# Patient Record
Sex: Male | Born: 1990 | Hispanic: Yes | Marital: Married | Smoking: Never smoker
Health system: Southern US, Community
[De-identification: ages and names within clinical notes are randomized; demographics above are authoritative.]

## PROBLEM LIST (undated history)

## (undated) DIAGNOSIS — G4733 Obstructive sleep apnea (adult) (pediatric): Secondary | ICD-10-CM

---

## 2021-02-08 ENCOUNTER — Emergency Department: Payer: 59

## 2021-02-08 ENCOUNTER — Emergency Department
Admission: EM | Admit: 2021-02-08 | Discharge: 2021-02-08 | Disposition: A | Discharge status: Home / Self Care | Payer: 59 | Attending: Emergency Medicine | Admitting: Emergency Medicine

## 2021-02-08 ENCOUNTER — Other Ambulatory Visit: Payer: Self-pay

## 2021-02-08 ENCOUNTER — Encounter: Payer: Self-pay | Admitting: Emergency Medicine

## 2021-02-08 DIAGNOSIS — G43109 Migraine with aura, not intractable, without status migrainosus: Secondary | ICD-10-CM

## 2021-02-08 DIAGNOSIS — R410 Disorientation, unspecified: Secondary | ICD-10-CM | POA: Diagnosis not present

## 2021-02-08 DIAGNOSIS — G43809 Other migraine, not intractable, without status migrainosus: Secondary | ICD-10-CM | POA: Insufficient documentation

## 2021-02-08 DIAGNOSIS — R0602 Shortness of breath: Secondary | ICD-10-CM | POA: Insufficient documentation

## 2021-02-08 DIAGNOSIS — R519 Headache, unspecified: Secondary | ICD-10-CM | POA: Diagnosis present

## 2021-02-08 DIAGNOSIS — R209 Unspecified disturbances of skin sensation: Secondary | ICD-10-CM | POA: Insufficient documentation

## 2021-02-08 DIAGNOSIS — R4182 Altered mental status, unspecified: Secondary | ICD-10-CM | POA: Diagnosis not present

## 2021-02-08 HISTORY — DX: Obstructive sleep apnea (adult) (pediatric): G47.33

## 2021-02-08 LAB — CBC
HCT: 45.4 % (ref 39.0–52.0)
Hemoglobin: 15.4 g/dL (ref 13.0–17.0)
MCH: 28.4 pg (ref 26.0–34.0)
MCHC: 33.9 g/dL (ref 30.0–36.0)
MCV: 83.8 fL (ref 80.0–100.0)
Platelets: 309 10*3/uL (ref 150–400)
RBC: 5.42 MIL/uL (ref 4.22–5.81)
RDW: 12.8 % (ref 11.5–15.5)
WBC: 8.8 10*3/uL (ref 4.0–10.5)
nRBC: 0 % (ref 0.0–0.2)

## 2021-02-08 LAB — COMPREHENSIVE METABOLIC PANEL
ALT: 26 U/L (ref 0–44)
AST: 45 U/L — ABNORMAL HIGH (ref 15–41)
Albumin: 5.4 g/dL — ABNORMAL HIGH (ref 3.5–5.0)
Alkaline Phosphatase: 29 U/L — ABNORMAL LOW (ref 38–126)
Anion gap: 15 (ref 5–15)
BUN: 17 mg/dL (ref 6–20)
CO2: 20 mmol/L — ABNORMAL LOW (ref 22–32)
Calcium: 10.1 mg/dL (ref 8.9–10.3)
Chloride: 104 mmol/L (ref 98–111)
Creatinine, Ser: 1.02 mg/dL (ref 0.61–1.24)
GFR, Estimated: >60 mL/min (ref >60)
Glucose, Bld: 161 mg/dL — ABNORMAL HIGH (ref 70–99)
Potassium: 3.5 mmol/L (ref 3.5–5.1)
Sodium: 139 mmol/L (ref 135–145)
Total Bilirubin: 0.9 mg/dL (ref 0.3–1.2)
Total Protein: 8.3 g/dL — ABNORMAL HIGH (ref 6.5–8.1)

## 2021-02-08 LAB — DIFFERENTIAL
Abs Immature Granulocytes: 0.04 10*3/uL (ref 0.00–0.07)
Basophils Absolute: 0 10*3/uL (ref 0.0–0.1)
Basophils Relative: 1 %
Eosinophils Absolute: 0 10*3/uL (ref 0.0–0.5)
Eosinophils Relative: 0 %
Immature Granulocytes: 1 %
Lymphocytes Relative: 13 %
Lymphs Abs: 1.2 10*3/uL (ref 0.7–4.0)
Monocytes Absolute: 0.2 10*3/uL (ref 0.1–1.0)
Monocytes Relative: 3 %
Neutro Abs: 7.3 10*3/uL (ref 1.7–7.7)
Neutrophils Relative %: 82 %

## 2021-02-08 LAB — PROTIME-INR
INR: 1.2 (ref 0.8–1.2)
Prothrombin Time: 14.9 seconds (ref 11.4–15.2)

## 2021-02-08 LAB — ETHANOL: Alcohol, Ethyl (B): <10 mg/dL (ref <10)

## 2021-02-08 LAB — APTT: aPTT: 23 seconds — ABNORMAL LOW (ref 24–36)

## 2021-02-08 IMAGING — CT CT ANGIO HEAD-NECK (W OR W/O PERF)
2 of 11 series · 7 of 33 positions shown · IV contrast (APPLIED)
Comparison: None.

CLINICAL DATA: Neuro deficit, acute, stroke suspected.

EXAM:
CT ANGIOGRAPHY HEAD AND NECK
TECHNIQUE: Multidetector CT imaging of the head and neck was performed using
the standard protocol during bolus administration of intravenous
contrast. Multiplanar CT image reconstructions and MIPs were
obtained to evaluate the vascular anatomy. Carotid stenosis
measurements (when applicable) are obtained utilizing NASCET
criteria, using the distal internal carotid diameter as the
denominator.
CONTRAST:  75mL OMNIPAQUE IOHEXOL 350 MG/ML SOLN

[Series 8: cta head neck (person_name) · axial · 0.60mm/px · z∈[-210,-88]mm · 2 of 184 slices shown]
[im 62/184  soft-tissue]
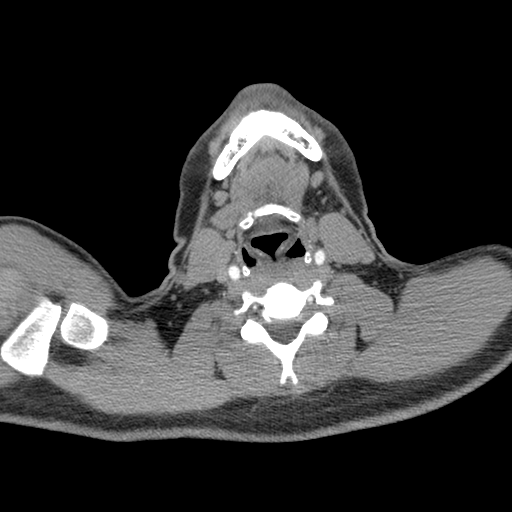
[im 123/184  soft-tissue]
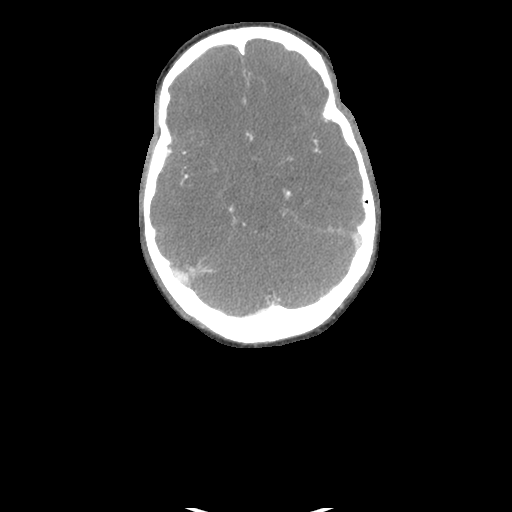

[Series 10: ax thin · axial · 0.50mm/px · z∈[-305,-72]mm · 5 of 359 slices shown]
[im 60/359  soft-tissue]
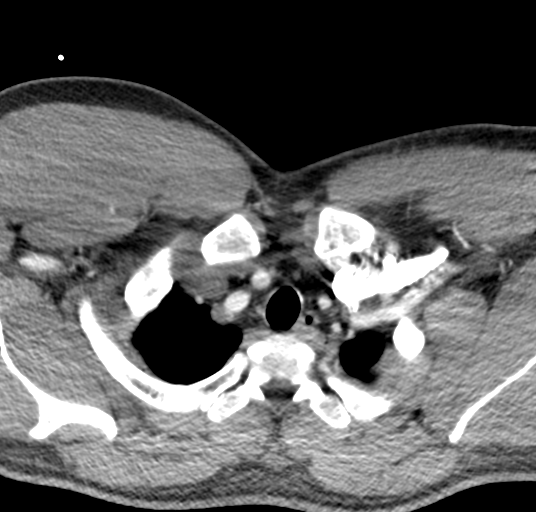
[im 120/359  bone]
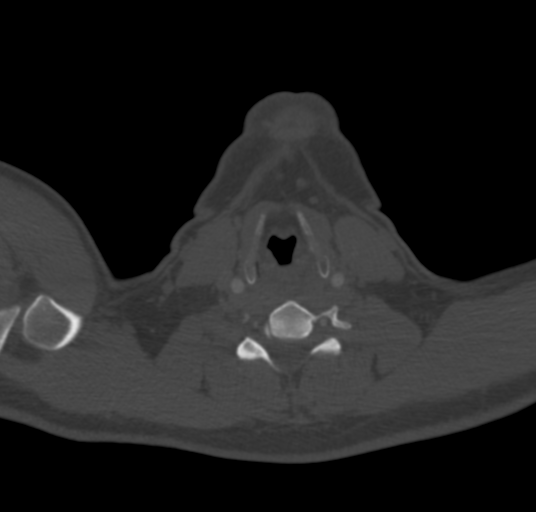
[im 180/359  soft-tissue]
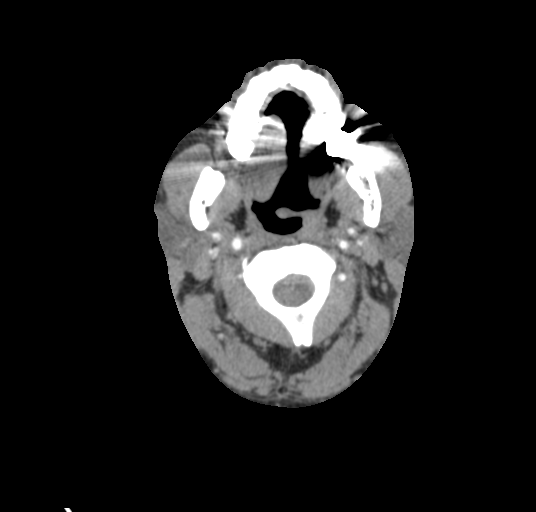
[im 239/359  bone]
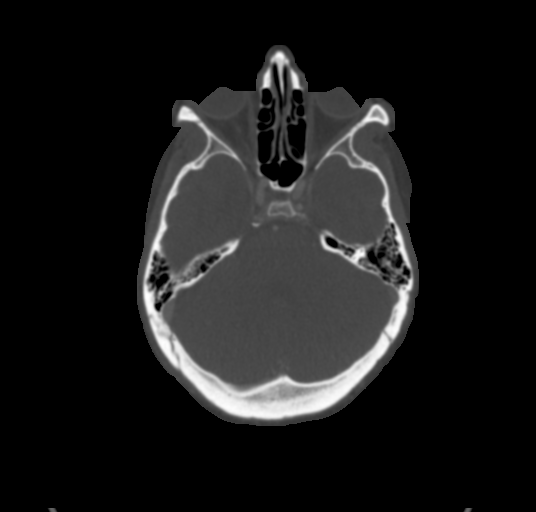
[im 299/359  soft-tissue]
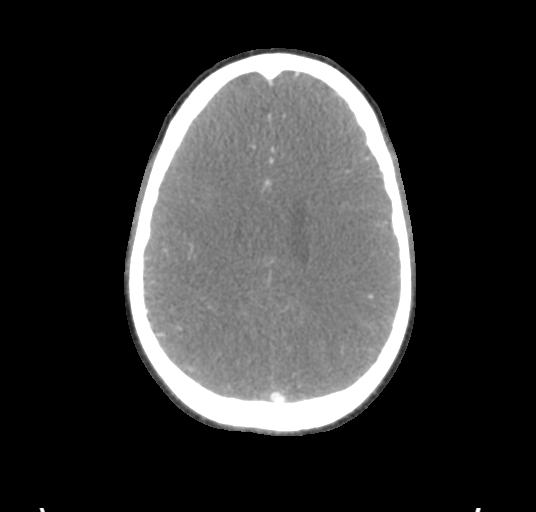

[7 of 33 positions shown; findings below may reference images not displayed]

FINDINGS: CT HEAD FINDINGS

Brain: No evidence of acute infarction, hemorrhage, hydrocephalus,
extra-axial collection or mass lesion/mass effect. Cystic dilatation
of the cavum of the cavum septum pellucidum cavum vergae.

Vascular: No hyperdense vessel or unexpected calcification.

Skull: Normal. Negative for fracture or focal lesion.

Sinuses: Imaged portions are clear.

Orbits: No acute finding.

Review of the MIP images confirms the above findings

CTA NECK FINDINGS

Aortic arch: Common origin of innominate and left common carotid
artery from the aortic. Imaged portion shows no evidence of aneurysm
or dissection. No significant stenosis of the major arch vessel
origins.

Right carotid system: No evidence of dissection, stenosis (50% or
greater) or occlusion.

Left carotid system: No evidence of dissection, stenosis (50% or
greater) or occlusion.

Vertebral arteries: Dominant left vertebral artery with normal
course and caliber. Hypoplastic right vertebral artery ending in
PICA.

Skeleton: Negative.

Other neck: Negative.

Upper chest: Negative.

Review of the MIP images confirms the above findings

CTA HEAD FINDINGS

Anterior circulation: No significant stenosis, proximal occlusion,
aneurysm, or vascular malformation.

Posterior circulation: No significant stenosis, proximal occlusion,
aneurysm, or vascular malformation.

Venous sinuses: As permitted by contrast timing, patent.

Anatomic variants: Hypoplastic right vertebral artery ends in PICA.

Review of the MIP images confirms the above findings
IMPRESSION: 1. No acute intracranial abnormality.
2. No intracranial large vessel occlusion or hemodynamically
significant stenosis.
3. No aneurysm, AVM or dural AVF.
4. No hemodynamically significant stenosis of the major neck
arteries.
5. Incidentally noted cystic dilatation of the cavum of the cavum
septum pellucidum/cavum vergae. No hydrocephalus.

## 2021-02-08 MED ORDER — DIPHENHYDRAMINE HCL 50 MG/ML IJ SOLN
25.0000 mg | Freq: Once | INTRAMUSCULAR | Status: AC
  Administered 2021-02-08: 25 mg via INTRAVENOUS
  Filled 2021-02-08: qty 1

## 2021-02-08 MED ORDER — IOHEXOL 350 MG/ML SOLN
75.0000 mL | Freq: Once | INTRAVENOUS | Status: AC | PRN
  Administered 2021-02-08: 75 mL via INTRAVENOUS

## 2021-02-08 MED ORDER — KETOROLAC TROMETHAMINE 30 MG/ML IJ SOLN
15.0000 mg | Freq: Once | INTRAMUSCULAR | Status: AC
  Administered 2021-02-08: 15 mg via INTRAVENOUS
  Filled 2021-02-08: qty 1

## 2021-02-08 MED ORDER — DEXAMETHASONE SODIUM PHOSPHATE 10 MG/ML IJ SOLN
10.0000 mg | Freq: Once | INTRAMUSCULAR | Status: AC
  Administered 2021-02-08: 10 mg via INTRAVENOUS
  Filled 2021-02-08: qty 1

## 2021-02-08 MED ORDER — LACTATED RINGERS IV BOLUS
1000.0000 mL | Freq: Once | INTRAVENOUS | Status: AC
  Administered 2021-02-08: 1000 mL via INTRAVENOUS

## 2021-02-08 MED ORDER — BUTALBITAL-APAP-CAFFEINE 50-325-40 MG PO TABS: 1.0000 | ORAL_TABLET | Freq: Four times a day (QID) | ORAL | 0 refills | Status: AC | PRN

## 2021-02-08 MED ORDER — PROCHLORPERAZINE EDISYLATE 10 MG/2ML IJ SOLN
10.0000 mg | Freq: Once | INTRAMUSCULAR | Status: AC
  Administered 2021-02-08: 10 mg via INTRAVENOUS
  Filled 2021-02-08: qty 2

## 2021-02-08 NOTE — Discharge Instructions (Signed)
As we discussed, I'd recommend seeing a Neurologist for an MRI, further work-up. Ideally, in the next 1-2 weeks.  Call your OSA doctor, who may be able to order this for you.  If not, Dr. Malvin Johns is a local Neurologist, who I believe is [facility]-affiliated.   If anything changes, come back and we'll be happy to see you and order this.

## 2021-02-08 NOTE — ED Triage Notes (Signed)
First Nurse Note:  C/O headache since 0800.  C/o feeling weak, vomiting and hand numbness.  Patient is AAOx3.  Skin warm and dry. Appears uncomfortable.  Speech clear.  MAE equally and strong.

## 2021-02-08 NOTE — ED Provider Notes (Signed)
[facility] Emergency Department Provider Note  ____________________________________________   Event Date/Time   First MD Initiated Contact with Patient 02/08/21 1613     (approximate)  I have reviewed the triage vital signs and the nursing notes.   HISTORY  Chief Complaint Headache, Shortness of Breath, and Nausea    HPI Patrick Kirby is a 30 y.o. male  with h/o OSA here with headache, altered mental status. Pt arrives with his wife, who provides most history. Per report, pt c/o headache to his wife this morning at around 9 AM via text. 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 He had an energy drink this AM but this is normal for him. He texted her later this afternoon saying his headache was still severe, he was having difficulty seeing,a nd reportedly was confused. He lives in [place] so they came here immediately. He currently c/o 7/10 generalized headache. He is slightly confused, unable to provide much additional clarification. Reports he has had some blurred vision. Reports "tingling" in hs b/l hands as well. No known recent trauma.         Past Medical History:  Diagnosis Date   OSA (obstructive sleep apnea)     There are no problems to display for this patient.   History reviewed. No pertinent surgical history.  Prior to Admission medications   Medication Sig Start Date End Date Taking? Authorizing Provider  butalbital-acetaminophen-caffeine (FIORICET) 50-325-40 MG tablet Take 1-2 tablets by mouth every 6 (six) hours as needed for headache (no more than 6 tabs daily). 02/08/21 02/08/22 Yes Shaune Pollack, MD  albuterol (VENTOLIN HFA) 108 (90 Base) MCG/ACT inhaler Inhale 2 puffs into the lungs every 6 (six) hours as needed for wheezing or shortness of breath. 10/07/20   [provider]  sertraline (ZOLOFT) 100 MG tablet Take 100 mg by mouth daily.  01/19/21   [provider]    Allergies Patient has no known allergies.  History reviewed. No pertinent family history.  Social History Social History   Tobacco Use   Smoking status: Never   Smokeless tobacco: Never  Substance Use Topics   Alcohol use: Yes    Comment: occasional   Drug use: Not Currently    Review of Systems  Review of Systems  Unable to perform ROS: Mental status change  Neurological:  Positive for headaches.    ____________________________________________  PHYSICAL EXAM:      VITAL SIGNS: ED Triage Vitals  Enc Vitals Group     BP --      Pulse Rate 02/08/21 1554 89     Resp 02/08/21 1554 (!) 40     Temp 02/08/21 1554 97.8 F (36.6 C)     Temp Source 02/08/21 1554 Oral     SpO2 02/08/21 1554 100 %     Weight 02/08/21 1555 170 lb (77.1 kg)     Height 02/08/21 1555 5\' 5"  (1.651 m)     Head Circumference --      Peak Flow --      Pain Score 02/08/21 1555 8     Pain Loc --      Pain Edu? --      Excl. in GC? --      Physical Exam Vitals and nursing note reviewed.  Constitutional:      General: He is not in acute distress.    Appearance: He is well-developed.  HENT:     Head: Normocephalic and atraumatic.  Eyes:     Conjunctiva/sclera: Conjunctivae normal.  Cardiovascular:     Rate and Rhythm: Normal rate and regular rhythm.     Heart sounds: Normal heart sounds.  Pulmonary:     Effort: Pulmonary effort is normal. No respiratory distress.     Breath sounds: No wheezing.  Abdominal:     General: There is no distension.  Musculoskeletal:     Cervical back: Neck supple.  Skin:    General: Skin is warm.     Capillary Refill: Capillary refill takes less than 2 seconds.     Findings: No rash.  Neurological:     Mental Status: He is alert and oriented to person, place, and time.     Motor: No abnormal muscle tone.     Neurological Exam:  Mental Status: Alert and oriented to person, place, and time. Attention and  concentration normal. Speech clear. Recent memory is intact. Cranial Nerves: Visual fields grossly intact. EOMI and PERRLA. No nystagmus noted. Facial sensation intact at forehead, maxillary cheek, and chin/mandible bilaterally. No facial asymmetry or weakness. Hearing grossly normal. Uvula is midline, and palate elevates symmetrically. Normal SCM and trapezius strength. Tongue midline without fasciculations. Motor: Muscle strength 5/5 in proximal and distal UE and LE bilaterally. No pronator drift. Muscle tone normal. Sensation: Intact to light touch in upper and lower extremities distally bilaterally.  Gait: Deferred Coordination: Normal FTN bilaterally.     ____________________________________________   LABS (all labs ordered are listed, but only abnormal results are displayed)  Labs Reviewed  APTT - Abnormal; Notable for the following components:      Result Value   aPTT 23 (*)    All other components within normal limits  COMPREHENSIVE METABOLIC PANEL - Abnormal; Notable for the following components:   CO2 20 (*)    Glucose, Bld 161 (*)    Total Protein 8.3 (*)    Albumin 5.4 (*)    AST 45 (*)    Alkaline Phosphatase 29 (*)    All other components within normal limits  PROTIME-INR  CBC  DIFFERENTIAL  ETHANOL  URINE DRUG SCREEN, QUALITATIVE ([facility] ONLY)  CBG MONITORING, ED    ____________________________________________  EKG:  ________________________________________  RADIOLOGY All imaging, including plain films, CT scans, and ultrasounds, independently reviewed by me, and interpretations confirmed via formal radiology reads.  ED MD interpretation:   CT Angio Head/Neck: Negative, no aneurysm, no signs of [facility]  Official radiology report(s): CT ANGIO HEAD NECK W WO CM  Result Date: 02/08/2021 CLINICAL DATA:  Neuro deficit, acute, stroke suspected. EXAM: CT ANGIOGRAPHY HEAD AND NECK TECHNIQUE: Multidetector CT imaging of the head and neck was performed using the  standard protocol during bolus administration of intravenous contrast. Multiplanar CT image reconstructions and MIPs were obtained to evaluate the vascular anatomy. Carotid stenosis measurements (when applicable) are obtained utilizing NASCET criteria, using the distal internal carotid diameter as the denominator. CONTRAST:  3mL OMNIPAQUE IOHEXOL 350 MG/ML SOLN COMPARISON:  None. FINDINGS: CT HEAD FINDINGS Brain: No evidence of acute infarction, hemorrhage, hydrocephalus, extra-axial collection or mass lesion/mass effect. Cystic dilatation of the cavum of the cavum septum pellucidum cavum vergae. Vascular: No hyperdense vessel or unexpected calcification. Skull: Normal. Negative for fracture or focal lesion. Sinuses: Imaged portions are clear. Orbits: No acute finding. Review of the MIP images confirms the above findings CTA NECK FINDINGS Aortic arch: Common origin of innominate and left common carotid artery from the aortic.  Imaged portion shows no evidence of aneurysm or dissection. No significant stenosis of the major arch vessel origins. Right carotid system: No evidence of dissection, stenosis (50% or greater) or occlusion. Left carotid system: No evidence of dissection, stenosis (50% or greater) or occlusion. Vertebral arteries: Dominant left vertebral artery with normal course and caliber. Hypoplastic right vertebral artery ending in PICA. Skeleton: Negative. Other neck: Negative. Upper chest: Negative. Review of the MIP images confirms the above findings CTA HEAD FINDINGS Anterior circulation: No significant stenosis, proximal occlusion, aneurysm, or vascular malformation. Posterior circulation: No significant stenosis, proximal occlusion, aneurysm, or vascular malformation. Venous sinuses: As permitted by contrast timing, patent. Anatomic variants: Hypoplastic right vertebral artery ends in PICA. Review of the MIP images confirms the above findings IMPRESSION: 1. No acute intracranial abnormality. 2. No  intracranial large vessel occlusion or hemodynamically significant stenosis. 3. No aneurysm, AVM or dural AVF. 4. No hemodynamically significant stenosis of the major neck arteries. 5. Incidentally noted cystic dilatation of the cavum of the cavum septum pellucidum/cavum vergae. No hydrocephalus. Electronically Signed   By: Baldemar Lenis M.D.   On: 02/08/2021 16:50    ____________________________________________  PROCEDURES   Procedure(s) performed (including Critical Care):  Procedures  ____________________________________________  INITIAL IMPRESSION / MDM / ASSESSMENT AND PLAN / ED COURSE  As part of my medical decision making, I reviewed the following data within the electronic MEDICAL RECORD NUMBER Nursing notes reviewed and incorporated, Old chart reviewed, Notes from prior ED visits, and [place] Controlled Substance Database       *Jeramee Rivera-Pratts was evaluated in Emergency Department on 02/08/2021 for the symptoms described in the history of present illness. He was evaluated in the context of the global COVID-19 pandemic, which necessitated consideration that the patient might be at risk for infection with the SARS-CoV-2 virus that causes COVID-19. Institutional protocols and algorithms that pertain to the evaluation of patients at risk for COVID-19 are in a state of rapid change based on information released by regulatory bodies including the CDC and federal and state organizations. These policies and algorithms were followed during the patient's care in the ED.  Some ED evaluations and interventions may be delayed as a result of limited staffing during the pandemic.*     Medical Decision Making:  30 yo M here with headache, confusion. On arrival, pt with severe headache, slight confusion, reported tingling in bl hands. Taken for stat CT Angio which was reviewed by me, unremarkable. No aneurysm or other abnormality. HA, mental changes completely resolved with  compazine/benadryl. On further questioning, pt has a h/o somewhat similar headaches, often preceded by R vision loss and unilateral HA, similar to a complicated migraine. His sx today started similarly. No ongoing neuro deficits or confusion. Labs are very reassuring. He does have mild hyperglycemia which was discussed - should f/u as outpt for possible A1C, repeat BP check. Otherwise, we discussed risks/benefits of MRI imaging emergently vs urgently as outpt. Given that pt is back to baseline, he would prefer outpt which I think is reasonable. Pt is a physician, fully understands risks/benefits and differential. He o/w has no fever chills, or infectious sx to suggest meningitis, encephalitis. Wife confirms he is back to baseline. HA began gradually, and with neg CT angio, do not suspect SAH. No focal deficits to suggest mass, lesion.   ____________________________________________  FINAL CLINICAL IMPRESSION(S) / ED DIAGNOSES  Final diagnoses:  Complicated migraine     MEDICATIONS GIVEN DURING THIS VISIT:  Medications  iohexol (  OMNIPAQUE) 350 MG/ML injection 75 mL (75 mLs Intravenous Contrast Given 02/08/21 1626)  prochlorperazine (COMPAZINE) injection 10 mg (10 mg Intravenous Given 02/08/21 1636)  diphenhydrAMINE (BENADRYL) injection 25 mg (25 mg Intravenous Given 02/08/21 1636)  lactated ringers bolus 1,000 mL (0 mLs Intravenous Stopped 02/08/21 1809)  ketorolac (TORADOL) 30 MG/ML injection 15 mg (15 mg Intravenous Given 02/08/21 1815)  dexamethasone (DECADRON) injection 10 mg (10 mg Intravenous Given 02/08/21 1814)     ED Discharge Orders          Ordered    butalbital-acetaminophen-caffeine (FIORICET) 50-325-40 MG tablet  Every 6 hours PRN        02/08/21 1835             Note:  This document was prepared using Dragon voice recognition software and may include unintentional dictation errors.   Shaune Pollack, MD 02/08/21 2021

## 2021-02-08 NOTE — ED Triage Notes (Signed)
Pt comes into the ED via POV c/o headache, nausea, and SHOB.  Pt has had 1 migraine previously but states this is worse.  PT presents hyperventilating in triage at this time with rapid breathing and tingling in his hands.  Pt presents with his wife who states that his speech is off from normal.  Pt does present having a hard time with speech currently.  Pt has also had one episode of vomiting.  Pt denies having any head injuries recently.
